# Patient Record
Sex: Male | Born: 1945 | Race: White | Hispanic: No | Marital: Married | Smoking: Former smoker
Health system: Southern US, Community
[De-identification: ages and names within clinical notes are randomized; demographics above are authoritative.]

## PROBLEM LIST (undated history)

## (undated) DIAGNOSIS — I10 Essential (primary) hypertension: Secondary | ICD-10-CM

## (undated) DIAGNOSIS — I719 Aortic aneurysm of unspecified site, without rupture: Secondary | ICD-10-CM

## (undated) DIAGNOSIS — I219 Acute myocardial infarction, unspecified: Secondary | ICD-10-CM

## (undated) DIAGNOSIS — I2510 Atherosclerotic heart disease of native coronary artery without angina pectoris: Secondary | ICD-10-CM

## (undated) HISTORY — PX: AORTA SURGERY: SHX548

## (undated) HISTORY — PX: CORONARY STENT PLACEMENT: SHX1402

---

## 2011-12-21 ENCOUNTER — Emergency Department (INDEPENDENT_AMBULATORY_CARE_PROVIDER_SITE_OTHER): Payer: BC Managed Care – PPO

## 2011-12-21 ENCOUNTER — Emergency Department (HOSPITAL_BASED_OUTPATIENT_CLINIC_OR_DEPARTMENT_OTHER)
Admission: EM | Admit: 2011-12-21 | Discharge: 2011-12-22 | Disposition: A | Discharge status: Home / Self Care | Payer: BC Managed Care – PPO | Attending: Emergency Medicine | Admitting: Emergency Medicine

## 2011-12-21 ENCOUNTER — Encounter (HOSPITAL_BASED_OUTPATIENT_CLINIC_OR_DEPARTMENT_OTHER): Payer: Self-pay | Admitting: *Deleted

## 2011-12-21 DIAGNOSIS — R197 Diarrhea, unspecified: Secondary | ICD-10-CM | POA: Insufficient documentation

## 2011-12-21 DIAGNOSIS — I10 Essential (primary) hypertension: Secondary | ICD-10-CM | POA: Insufficient documentation

## 2011-12-21 DIAGNOSIS — R109 Unspecified abdominal pain: Secondary | ICD-10-CM | POA: Insufficient documentation

## 2011-12-21 DIAGNOSIS — Z7982 Long term (current) use of aspirin: Secondary | ICD-10-CM | POA: Insufficient documentation

## 2011-12-21 DIAGNOSIS — I252 Old myocardial infarction: Secondary | ICD-10-CM | POA: Insufficient documentation

## 2011-12-21 DIAGNOSIS — K529 Noninfective gastroenteritis and colitis, unspecified: Secondary | ICD-10-CM

## 2011-12-21 DIAGNOSIS — Z79899 Other long term (current) drug therapy: Secondary | ICD-10-CM | POA: Insufficient documentation

## 2011-12-21 DIAGNOSIS — K5289 Other specified noninfective gastroenteritis and colitis: Secondary | ICD-10-CM | POA: Insufficient documentation

## 2011-12-21 DIAGNOSIS — R1033 Periumbilical pain: Secondary | ICD-10-CM

## 2011-12-21 DIAGNOSIS — R1031 Right lower quadrant pain: Secondary | ICD-10-CM

## 2011-12-21 DIAGNOSIS — I2510 Atherosclerotic heart disease of native coronary artery without angina pectoris: Secondary | ICD-10-CM | POA: Insufficient documentation

## 2011-12-21 HISTORY — DX: Aortic aneurysm of unspecified site, without rupture: I71.9

## 2011-12-21 HISTORY — DX: Acute myocardial infarction, unspecified: I21.9

## 2011-12-21 HISTORY — DX: Atherosclerotic heart disease of native coronary artery without angina pectoris: I25.10

## 2011-12-21 HISTORY — DX: Essential (primary) hypertension: I10

## 2011-12-21 LAB — URINALYSIS, ROUTINE W REFLEX MICROSCOPIC
Glucose, UA: NEGATIVE mg/dL
Leukocytes, UA: NEGATIVE
Protein, ur: NEGATIVE mg/dL
Specific Gravity, Urine: 1.021 (ref 1.005–1.030)
Urobilinogen, UA: 1 mg/dL (ref 0.0–1.0)

## 2011-12-21 LAB — DIFFERENTIAL
Basophils Absolute: 0 10*3/uL (ref 0.0–0.1)
Eosinophils Absolute: 0.2 10*3/uL (ref 0.0–0.7)
Eosinophils Relative: 2 % (ref 0–5)
Lymphocytes Relative: 9 % — ABNORMAL LOW (ref 12–46)
Monocytes Absolute: 0.9 10*3/uL (ref 0.1–1.0)

## 2011-12-21 LAB — CBC
HCT: 36.1 % — ABNORMAL LOW (ref 39.0–52.0)
MCH: 30.8 pg (ref 26.0–34.0)
MCHC: 34.3 g/dL (ref 30.0–36.0)
MCV: 89.6 fL (ref 78.0–100.0)
Platelets: 154 10*3/uL (ref 150–400)
RDW: 12.2 % (ref 11.5–15.5)
WBC: 13.2 10*3/uL — ABNORMAL HIGH (ref 4.0–10.5)

## 2011-12-21 LAB — COMPREHENSIVE METABOLIC PANEL
AST: 21 U/L (ref 0–37)
CO2: 30 mEq/L (ref 19–32)
Calcium: 9.1 mg/dL (ref 8.4–10.5)
Creatinine, Ser: 1.4 mg/dL — ABNORMAL HIGH (ref 0.50–1.35)
GFR calc Af Amer: 59 mL/min — ABNORMAL LOW (ref >90)
GFR calc non Af Amer: 51 mL/min — ABNORMAL LOW (ref >90)
Total Protein: 7.3 g/dL (ref 6.0–8.3)

## 2011-12-21 MED ORDER — OXYCODONE-ACETAMINOPHEN 5-325 MG PO TABS
1.0000 | ORAL_TABLET | Freq: Once | ORAL | Status: AC
Start: 2011-12-21 — End: 2011-12-21
  Administered 2011-12-21: 1 via ORAL
  Filled 2011-12-21: qty 1

## 2011-12-21 MED ORDER — OXYCODONE-ACETAMINOPHEN 5-325 MG PO TABS: 1.0000 | ORAL_TABLET | ORAL | Status: AC | PRN

## 2011-12-21 MED ORDER — METRONIDAZOLE IN NACL 5-0.79 MG/ML-% IV SOLN
500.0000 mg | Freq: Once | INTRAVENOUS | Status: AC
  Administered 2011-12-22: 500 mg via INTRAVENOUS
  Filled 2011-12-21: qty 100

## 2011-12-21 MED ORDER — CIPROFLOXACIN IN D5W 400 MG/200ML IV SOLN
400.0000 mg | Freq: Once | INTRAVENOUS | Status: AC
  Administered 2011-12-21: 400 mg via INTRAVENOUS
  Filled 2011-12-21: qty 200

## 2011-12-21 MED ORDER — PANTOPRAZOLE SODIUM 40 MG IV SOLR
40.0000 mg | Freq: Once | INTRAVENOUS | Status: AC
  Administered 2011-12-21: 40 mg via INTRAVENOUS
  Filled 2011-12-21: qty 40

## 2011-12-21 MED ORDER — ONDANSETRON HCL 4 MG/2ML IJ SOLN
4.0000 mg | Freq: Once | INTRAMUSCULAR | Status: AC
  Administered 2011-12-21: 4 mg via INTRAVENOUS
  Filled 2011-12-21: qty 2

## 2011-12-21 MED ORDER — HYDROMORPHONE HCL PF 1 MG/ML IJ SOLN
1.0000 mg | Freq: Once | INTRAMUSCULAR | Status: AC
  Administered 2011-12-21: 1 mg via INTRAVENOUS
  Filled 2011-12-21: qty 1

## 2011-12-21 MED ORDER — CIPROFLOXACIN HCL 500 MG PO TABS: 500.0000 mg | ORAL_TABLET | Freq: Two times a day (BID) | ORAL | Status: AC

## 2011-12-21 MED ORDER — ONDANSETRON 8 MG PO TBDP: 8.0000 mg | ORAL_TABLET | Freq: Three times a day (TID) | ORAL | Status: AC | PRN

## 2011-12-21 MED ORDER — IOHEXOL 300 MG/ML  SOLN
100.0000 mL | Freq: Once | INTRAMUSCULAR | Status: AC | PRN
  Administered 2011-12-21: 100 mL via INTRAVENOUS

## 2011-12-21 MED ORDER — IOHEXOL 300 MG/ML  SOLN
40.0000 mL | Freq: Once | INTRAMUSCULAR | Status: AC | PRN
  Administered 2011-12-21: 40 mL via ORAL

## 2011-12-21 MED ORDER — GI COCKTAIL ~~LOC~~
30.0000 mL | Freq: Once | ORAL | Status: AC
  Administered 2011-12-21: 30 mL via ORAL
  Filled 2011-12-21: qty 30

## 2011-12-21 MED ORDER — METRONIDAZOLE 500 MG PO TABS: 500.0000 mg | ORAL_TABLET | Freq: Two times a day (BID) | ORAL | Status: AC

## 2011-12-21 NOTE — ED Provider Notes (Signed)
History   This chart was scribed for Hilario Quarry, MD by Shari Heritage. The patient was seen in room MH11/MH11. Patient's care was started at 2100.  CSN: [id number]  Arrival date & time 12/21/11  2100   First MD Initiated Contact with Patient 12/21/11 2131      Chief Complaint  Patient presents with  . Abdominal Pain   Patient is a 66 y.o. male presenting with abdominal pain. The history is provided by the patient. No language interpreter was used.  Abdominal Pain The primary symptoms of the illness include abdominal pain.   Eugene Bond is a 66 y.o. male who presents to the Emergency Department complaining of non-radiating, burning, moderate to severe RLQ and periumbilical bdominal pain onset several hours ago after eating a sub. Patient reports associated diarrhea. Patient says that stool was loose with no hematochezia. Patient hs no recent history of reflex. Patient takes Crestor (10 mg), Aspirin and Plavix regularly. Patient denies nausea and vomiting. Patient with h/of HT, MI and aortic aneurysm.   PCP - [facility] - Chales Abrahams  Past Medical History  Diagnosis Date  . Myocardial infarct   . Aortic aneurysm   . Coronary artery disease   . Hypertension     Past Surgical History  Procedure Date  . Coronary stent placement   . Aorta surgery     History reviewed. No pertinent family history.  History  Substance Use Topics  . Smoking status: Former Games developer  . Smokeless tobacco: Not on file  . Alcohol Use: No      Review of Systems  Gastrointestinal: Positive for abdominal pain.   A complete 10 system review of systems was obtained and all systems are negative except as noted in the HPI and PMH.   Allergies  Review of patient's allergies indicates no known allergies.  Home Medications   Current Outpatient Rx  Name Route Sig Dispense Refill  . ASPIRIN 325 MG PO TBEC Oral Take 325 mg by mouth daily.    . ATENOLOL 100 MG PO TABS Oral Take 12.5 mg by  mouth daily.    Marland Kitchen CLOPIDOGREL BISULFATE 75 MG PO TABS Oral Take 75 mg by mouth daily.    Marland Kitchen LOSARTAN POTASSIUM 50 MG PO TABS Oral Take 50 mg by mouth daily.    Marland Kitchen ROSUVASTATIN CALCIUM 10 MG PO TABS Oral Take 10 mg by mouth daily.      BP 161/103  Pulse 46  Temp(Src) 98.2 F (36.8 C) (Oral)  Resp 16  SpO2 99%  Physical Exam  Nursing note and vitals reviewed. Constitutional: He is oriented to person, place, and time. He appears well-developed and well-nourished.  HENT:  Head: Normocephalic and atraumatic.  Eyes: Conjunctivae and EOM are normal. Pupils are equal, round, and reactive to light.  Neck: Normal range of motion. Neck supple.  Cardiovascular: Normal rate and regular rhythm.   Pulmonary/Chest: Effort normal and breath sounds normal.  Abdominal: Soft. Bowel sounds are normal. There is tenderness (RLQ).  Musculoskeletal: Normal range of motion.  Neurological: He is alert and oriented to person, place, and time.  Skin: Skin is warm and dry.  Psychiatric: He has a normal mood and affect.    ED Course  Procedures (including critical care time) DIAGNOSTIC STUDIES: Oxygen Saturation is 99% on room air, normal by my interpretation.    COORDINATION OF CARE: 9:36PM- Patient informed of current plan for treatment and evaluation and agrees with plan at this time. Will order labs and administer  IV fluids with pain medications.  Labs Reviewed  CBC - Abnormal; Notable for the following:    WBC 13.2 (*)    RBC 4.03 (*)    Hemoglobin 12.4 (*)    HCT 36.1 (*)    All other components within normal limits  DIFFERENTIAL - Abnormal; Notable for the following:    Neutrophils Relative 83 (*)    Neutro Abs 10.9 (*)    Lymphocytes Relative 9 (*)    All other components within normal limits  COMPREHENSIVE METABOLIC PANEL - Abnormal; Notable for the following:    Glucose, Bld 118 (*)    Creatinine, Ser 1.40 (*)    Total Bilirubin 0.2 (*)    GFR calc non Af Amer 51 (*)    GFR calc Af  Amer 59 (*)    All other components within normal limits  LIPASE, BLOOD  URINALYSIS, ROUTINE W REFLEX MICROSCOPIC   Ct Abdomen Pelvis W Contrast  12/21/2011  *RADIOLOGY REPORT*  Clinical Data: Periumbilical and right lower quadrant pain.  CT ABDOMEN AND PELVIS WITH CONTRAST  Technique:  Multidetector CT imaging of the abdomen and pelvis was performed following the standard protocol during bolus administration of intravenous contrast.  Contrast: 40mL OMNIPAQUE IOHEXOL 300 MG/ML  SOLN, OMNIPAQUE IOHEXOL 300 MG/ML  SOLN  Comparison: None.  Findings: The lung bases are clear.  No pleural or pericardial effusion.  There is marked thickening of the walls of the cecum with infiltration of surrounding fat.  The appendix is well seen and normal in appearance.  The colon is otherwise normal in appearance. Stomach and small bowel appear normal.  There is no fluid collection.  Fecal type material the distal ileum is consistent with slow transit.  No lymphadenopathy is seen.  The gallbladder, liver, adrenal glands, spleen, pancreas and kidneys appear normal.  The patient is status post aortic aneurysm repair with a stent graft in place.  No evidence of hemorrhage. There is no focal bony abnormality.  IMPRESSION:  1.  Marked wall thickening of the cecum with surrounding infiltration of fat is likely due to infectious or inflammatory change.  If the patient is not current with colon cancer screening, screening is recommended after his acute episode has passed. 2.  Normal appendix. 3.  Status post aortic aneurysm repair without evidence of complication.  Original Report Authenticated By: Bernadene Bell. Maricela Curet, M.D.     No diagnosis found.  Date: 12/21/2011  Rate: 41  Rhythm: sinus bradycardia  QRS Axis: normal  Intervals: normal  ST/T Wave abnormalities: normal  Conduction Disutrbances:none  Narrative Interpretation:   Old EKG Reviewed: none available     MDM  Patient with wall thickening of the cecum  noted on CT scan. He is given Cipro and Flagyl here. Patient is advised of the above findings. He'll be treated with Cipro and Flagyl at home. He is advised followup with his primary care doctor for referral for followup colonoscopy after the Cipro and Flagyl have been completed.  The patient with marked sinus bradycardia on his EKG consistent with atenolol use. His blood pressure has been high to normal here.   Patient with improved symptom after medication here and understand plan.   I personally performed the services described in this documentation, which was scribed in my presence. The recorded information has been reviewed and considered.        Hilario Quarry, MD 12/22/11 [phone]

## 2011-12-21 NOTE — ED Notes (Signed)
Education done with pt and family with regards to EKG and CT scan.  Dr Rosalia Hammers at bedside explaining results to pt.  Pt and family had additional questions re: inflammation and infection of colon.  Re-ititerated Dr Rays explanations and urged pt to follow up with GI.  Pt and family both stated understanding

## 2011-12-21 NOTE — Discharge Instructions (Signed)
Colitis Colitis is inflammation of the colon. Colitis can be a short-term or long-standing (chronic) illness. Crohn's disease and ulcerative colitis are 2 types of colitis which are chronic. They usually require lifelong treatment. CAUSES  There are many different causes of colitis, including:  Viruses.   Germs (bacteria).   Medicine reactions.  SYMPTOMS   Diarrhea.   Intestinal bleeding.   Pain.   Fever.   Throwing up (vomiting).   Tiredness (fatigue).   Weight loss.   Bowel blockage.  DIAGNOSIS  The diagnosis of colitis is based on examination and stool or blood tests. X-rays, CT scan, and colonoscopy may also be needed. TREATMENT  Treatment may include:  Fluids given through the vein (intravenously).   Bowel rest (nothing to eat or drink for a period of time).   Medicine for pain and diarrhea.   Medicines (antibiotics) that kill germs.   Cortisone medicines.   Surgery.  HOME CARE INSTRUCTIONS   Get plenty of rest.   Drink enough water and fluids to keep your urine clear or pale yellow.   Eat a well-balanced diet.   Call your caregiver for follow-up as recommended.  SEEK IMMEDIATE MEDICAL CARE IF:   You develop chills.   You have an oral temperature above 102 F (38.9 C), not controlled by medicine.   You have extreme weakness, fainting, or dehydration.   You have repeated vomiting.   You develop severe belly (abdominal) pain or are passing bloody or tarry stools.  MAKE SURE YOU:   Understand these instructions.   Will watch your condition.   Will get help right away if you are not doing well or get worse.  Document Released: 09/05/2004 Document Revised: 07/18/2011 Document Reviewed: 12/01/2009 [facility] Patient Information 2012 [place], [place].   Please return if worse at any time as we discussed. Please recheck with your Dr. on Monday and have him review the CT scan and arrange for followup for colonoscopy.

## 2011-12-21 NOTE — ED Notes (Signed)
Pt states that this PM after lunch he began having generalized abd pain he describes as a "ball of fire in his stomach" denies N/V but has had 2 episodes of diarrhea

## 2011-12-22 NOTE — ED Notes (Signed)
rx x 4 given for zofran, percoet, cipro and flagyl

## 2013-02-27 IMAGING — CT ABDOMEN^ABDOMEN_PELVIS_WITH (ADULT)
2 of 5 series · 17 of 46 positions shown, 19 images · IV contrast (APPLIED)
Comparison: None.

CLINICAL DATA: Periumbilical and right lower quadrant pain.

CT ABDOMEN AND PELVIS WITH CONTRAST
TECHNIQUE: Multidetector CT imaging of the abdomen and pelvis was
performed following the standard protocol during bolus
administration of intravenous contrast.
Contrast: 40mL OMNIPAQUE IOHEXOL 300 MG/ML  SOLN, 100mL OMNIPAQUE
IOHEXOL 300 MG/ML  SOLN

[Series 2: abd/pelvis 5.0 b31f · axial · 0.71mm/px · z∈[-479,-59]mm · 14 of 94 slices shown, 16 images]
[im 5/94  soft-tissue]
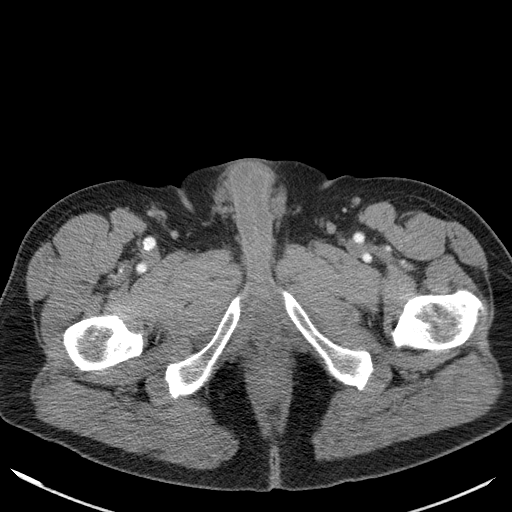
[im 5/94  bone]
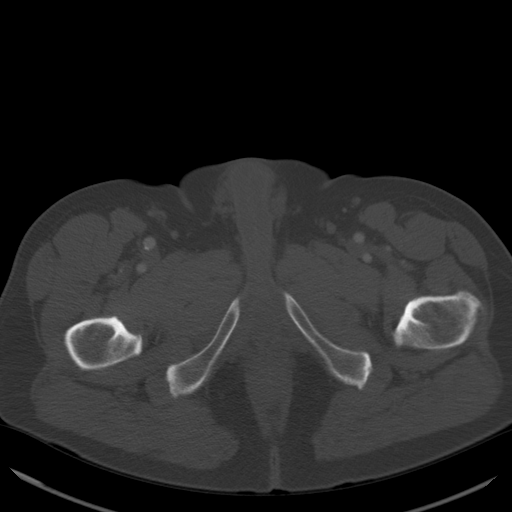
[im 14/94  soft-tissue]
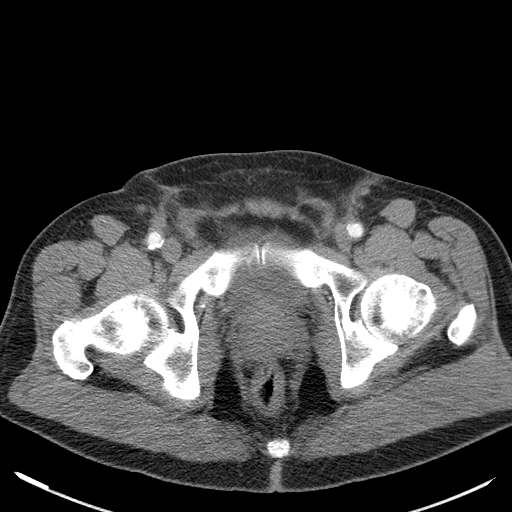
[im 19/94  soft-tissue]
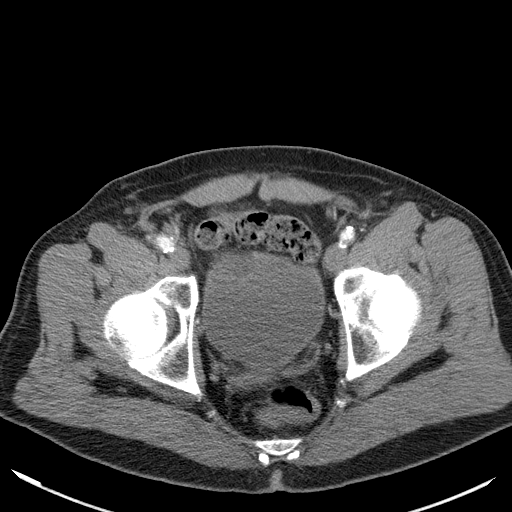
[im 24/94  soft-tissue]
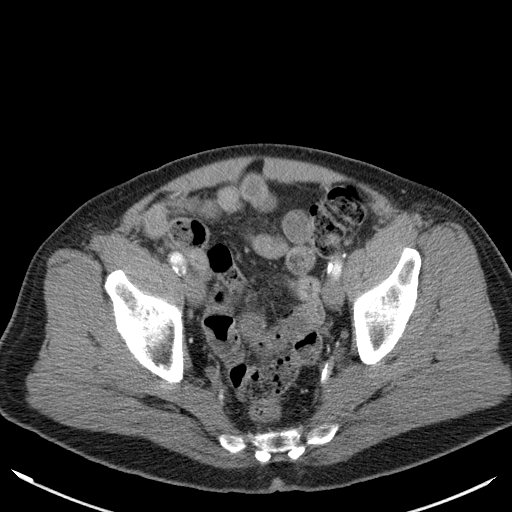
[im 33/94  soft-tissue]
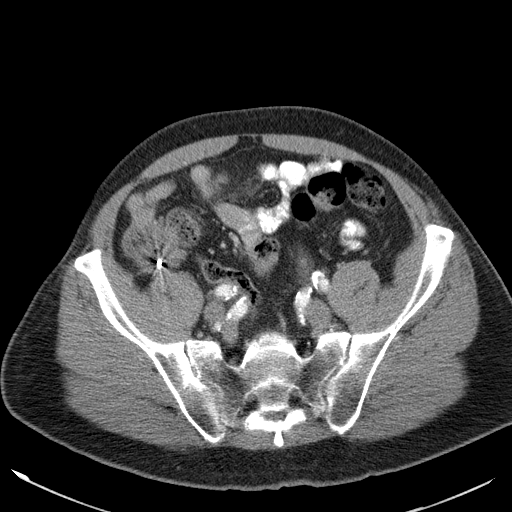
[im 38/94  soft-tissue]
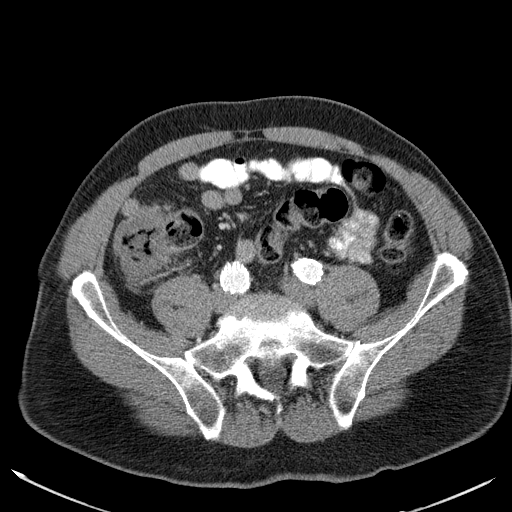
[im 42/94  soft-tissue]
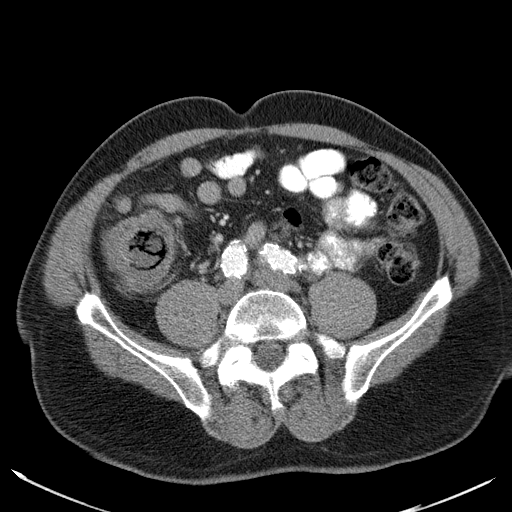
[im 52/94  soft-tissue]
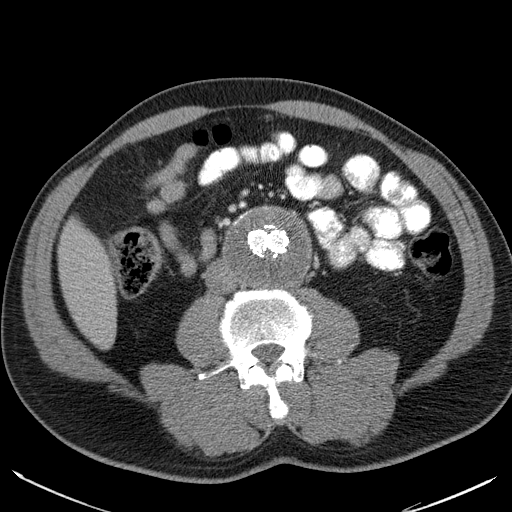
[im 56/94  soft-tissue]
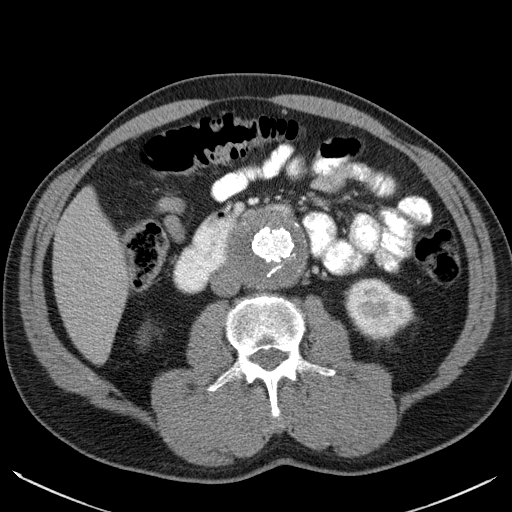
[im 56/94  bone]
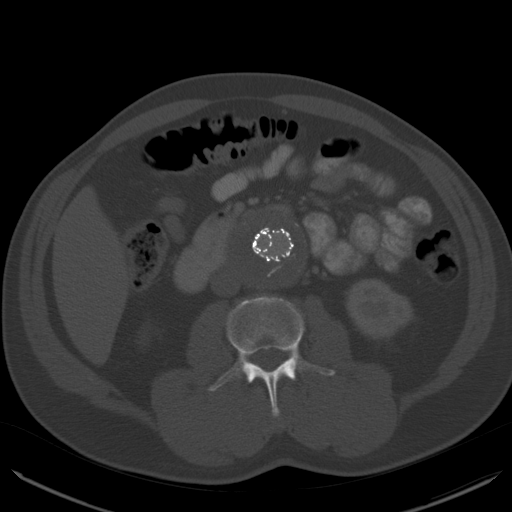
[im 61/94  soft-tissue]
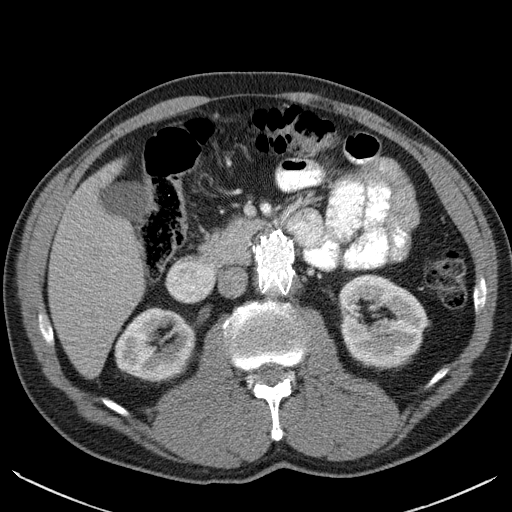
[im 70/94  soft-tissue]
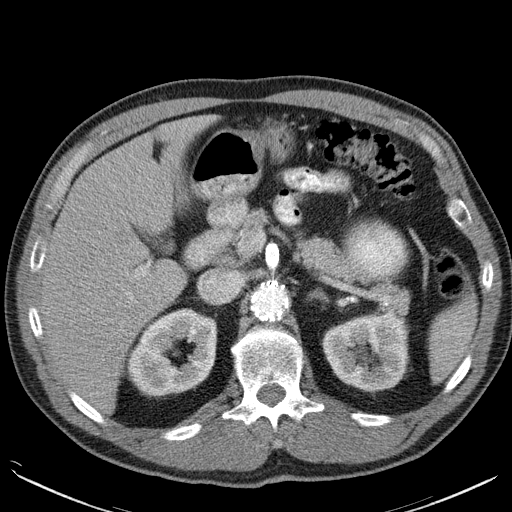
[im 75/94  soft-tissue]
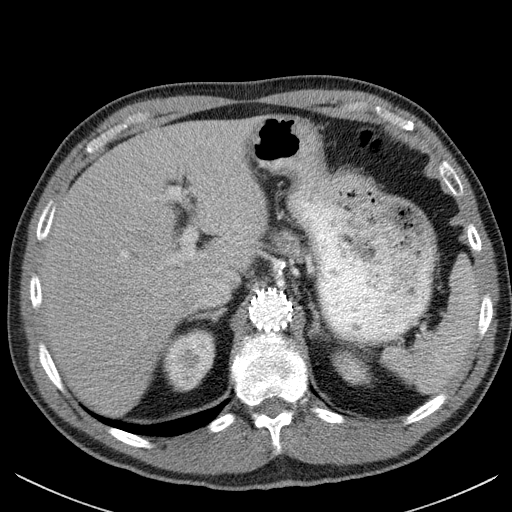
[im 80/94  soft-tissue]
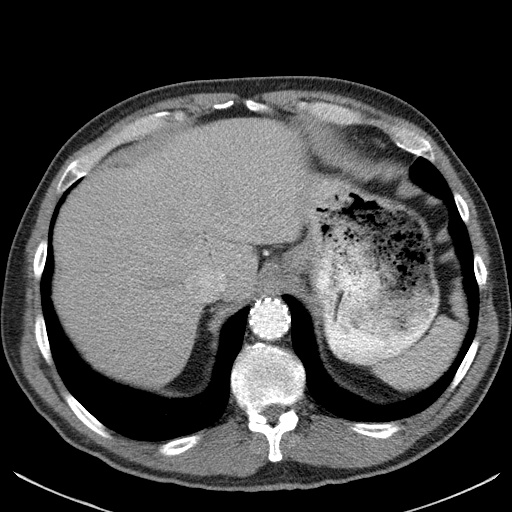
[im 89/94  soft-tissue]
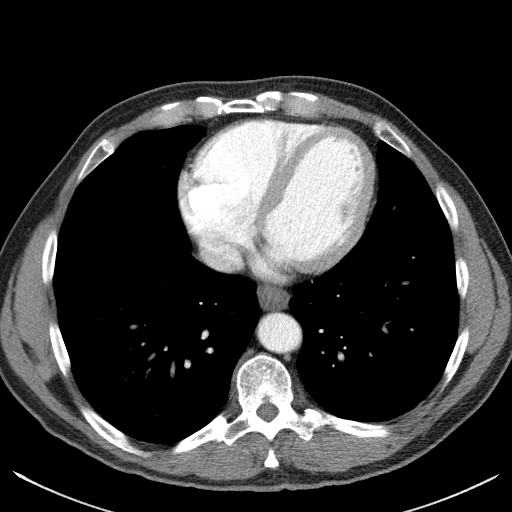

[Series 5: abd/pelvis 3.0 coronal · coronal · 0.87mm/px · 3 of 87 slices shown]
[im 29/87  soft-tissue]
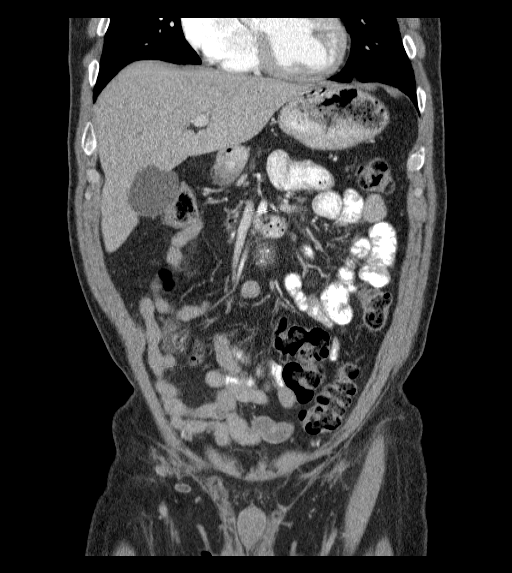
[im 39/87  soft-tissue]
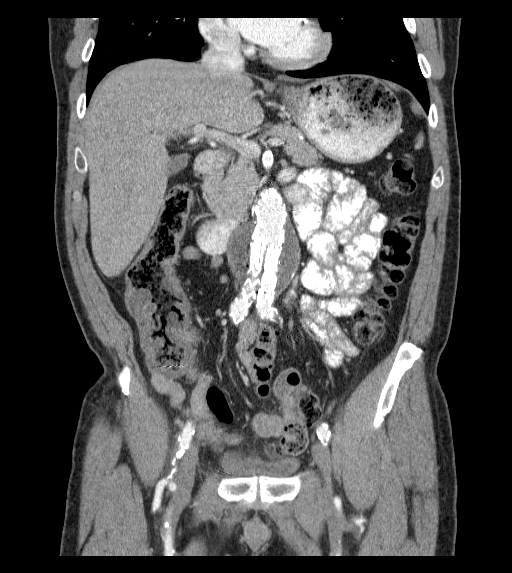
[im 48/87  soft-tissue]
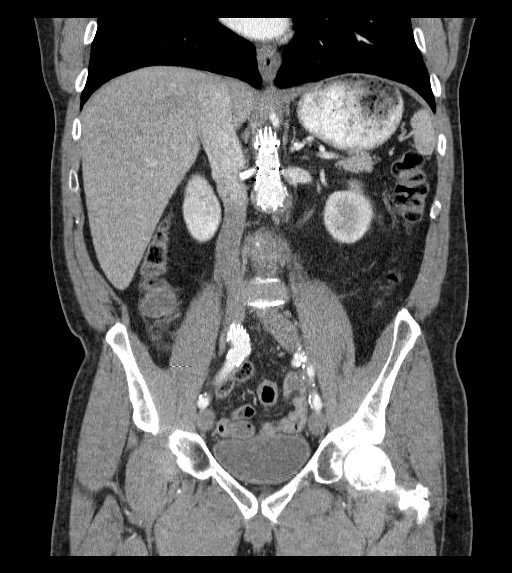

[17 of 46 positions shown; findings below may reference images not displayed]

FINDINGS: The lung bases are clear.  No pleural or pericardial
effusion.

There is marked thickening of the walls of the cecum with
infiltration of surrounding fat.  The appendix is well seen and
normal in appearance.  The colon is otherwise normal in appearance.
Stomach and small bowel appear normal.  There is no fluid
collection.  Fecal type material the distal ileum is consistent
with slow transit.  No lymphadenopathy is seen.

The gallbladder, liver, adrenal glands, spleen, pancreas and
kidneys appear normal.  The patient is status post aortic aneurysm
repair with a stent graft in place.  No evidence of hemorrhage.
There is no focal bony abnormality.
IMPRESSION: 1.  Marked wall thickening of the cecum with surrounding
infiltration of fat is likely due to infectious or inflammatory
change.  If the patient is not current with colon cancer screening,
screening is recommended after his acute episode has passed.
2.  Normal appendix.
3.  Status post aortic aneurysm repair without evidence of
complication.

## 2016-03-03 ENCOUNTER — Emergency Department (HOSPITAL_BASED_OUTPATIENT_CLINIC_OR_DEPARTMENT_OTHER)
Admission: EM | Admit: 2016-03-03 | Discharge: 2016-03-03 | Disposition: A | Discharge status: Home / Self Care | Payer: Medicare Other | Attending: Emergency Medicine | Admitting: Emergency Medicine

## 2016-03-03 ENCOUNTER — Encounter (HOSPITAL_BASED_OUTPATIENT_CLINIC_OR_DEPARTMENT_OTHER): Payer: Self-pay

## 2016-03-03 DIAGNOSIS — Z7982 Long term (current) use of aspirin: Secondary | ICD-10-CM | POA: Insufficient documentation

## 2016-03-03 DIAGNOSIS — Z87891 Personal history of nicotine dependence: Secondary | ICD-10-CM | POA: Diagnosis not present

## 2016-03-03 DIAGNOSIS — Y929 Unspecified place or not applicable: Secondary | ICD-10-CM | POA: Diagnosis not present

## 2016-03-03 DIAGNOSIS — I10 Essential (primary) hypertension: Secondary | ICD-10-CM | POA: Diagnosis not present

## 2016-03-03 DIAGNOSIS — I2510 Atherosclerotic heart disease of native coronary artery without angina pectoris: Secondary | ICD-10-CM | POA: Insufficient documentation

## 2016-03-03 DIAGNOSIS — Y939 Activity, unspecified: Secondary | ICD-10-CM | POA: Diagnosis not present

## 2016-03-03 DIAGNOSIS — Y999 Unspecified external cause status: Secondary | ICD-10-CM | POA: Insufficient documentation

## 2016-03-03 DIAGNOSIS — W208XXA Other cause of strike by thrown, projected or falling object, initial encounter: Secondary | ICD-10-CM | POA: Diagnosis not present

## 2016-03-03 DIAGNOSIS — S61411A Laceration without foreign body of right hand, initial encounter: Secondary | ICD-10-CM | POA: Insufficient documentation

## 2016-03-03 DIAGNOSIS — I252 Old myocardial infarction: Secondary | ICD-10-CM | POA: Insufficient documentation

## 2016-03-03 DIAGNOSIS — S60511A Abrasion of right hand, initial encounter: Secondary | ICD-10-CM

## 2016-03-03 DIAGNOSIS — S60221A Contusion of right hand, initial encounter: Secondary | ICD-10-CM

## 2016-03-03 DIAGNOSIS — S6991XA Unspecified injury of right wrist, hand and finger(s), initial encounter: Secondary | ICD-10-CM | POA: Diagnosis present

## 2016-03-03 NOTE — ED Triage Notes (Signed)
Pt reports laceration to right hand yesterday. Bleeding controlled. Pt is on plavix. Reports last tetanus was within past 5 years.

## 2016-03-03 NOTE — ED Notes (Signed)
MD at bedside. 

## 2016-03-03 NOTE — Discharge Instructions (Signed)
Local wound care with bacitracin and dressing changes twice daily.  Return to the emergency department if you develop increasing pain, redness, pus straining from the wound, or streaking up or down the arm.

## 2016-03-03 NOTE — ED Provider Notes (Addendum)
[facility] Provider [facility]   CSN: [id number] Arrival date & time: 03/03/16  0630  First Provider Contact:  None       History   Chief Complaint Chief Complaint  Patient presents with  . Extremity Laceration    HPI Eugene Bond is a 70 y.o. male.  Patient is a 70 year old male with history of MI with stents currently taking Plavix. He presents for evaluation of a right hand injury. He states he was moving a piece of furniture yesterday when a shelf fell and struck him on the hand. He has a superficial laceration to the dorsum of the right hand and swelling. Last tetanus less than 5 years ago   The history is provided by the patient.    Past Medical History:  Diagnosis Date  . Aortic aneurysm (HCC)   . Coronary artery disease   . Hypertension   . Myocardial infarct (HCC)     There are no active problems to display for this patient.   Past Surgical History:  Procedure Laterality Date  . AORTA SURGERY    . CORONARY STENT PLACEMENT         Home Medications    Prior to Admission medications   Medication Sig Start Date End Date Taking? Authorizing Provider  aspirin 325 MG EC tablet Take 325 mg by mouth daily.    Historical Provider, MD  atenolol (TENORMIN) 100 MG tablet Take 12.5 mg by mouth daily.    Historical Provider, MD  clopidogrel (PLAVIX) 75 MG tablet Take 75 mg by mouth daily.    Historical Provider, MD  losartan (COZAAR) 50 MG tablet Take 50 mg by mouth daily.    Historical Provider, MD  rosuvastatin (CRESTOR) 10 MG tablet Take 10 mg by mouth daily.    Historical Provider, MD    Family History No family history on file.  Social History Social History  Substance Use Topics  . Smoking status: Former Games developer  . Smokeless tobacco: Current User  . Alcohol use No     Allergies   Review of patient's allergies indicates no known allergies.   Review of Systems Review of Systems  All other systems reviewed and are  negative.    Physical Exam Updated Vital Signs BP 145/62 (BP Location: Left Arm)   Pulse (!) 52   Temp 97.8 F (36.6 C) (Oral)   Resp 16   Ht  (1.753 m)   Wt 175 lb (79.4 kg)   SpO2 96%   BMI 25.84 kg/m   Physical Exam  Constitutional: He is oriented to person, place, and time. He appears well-developed and well-nourished. No distress.  HENT:  Head: Normocephalic and atraumatic.  Neck: Normal range of motion. Neck supple.  Musculoskeletal:  The right hand is noted to have some swelling and ecchymosis. There is a superficial skin tear to the dorsum of the hand. Bleeding is controlled. He has full range of motion of the entire hand with no discomfort. Distal Refill is brisk and motor and sensation are intact distally.  Neurological: He is alert and oriented to person, place, and time.  Skin: Skin is warm and dry. He is not diaphoretic.  Nursing note and vitals reviewed.    ED Treatments / Results  Labs (all labs ordered are listed, but only abnormal results are displayed) Labs Reviewed - No data to display  EKG  EKG Interpretation None       Radiology No results found.  Procedures Procedures (including critical care time)  Medications Ordered in ED Medications - No data to display   Initial Impression / Assessment and Plan / ED Course  I have reviewed the triage vital signs and the nursing notes.  Pertinent labs & imaging results that were available during my care of the patient were reviewed by me and considered in my medical decision making (see chart for details).  Clinical Course    Patient has a hand contusion with a skin tear. There is nothing that is amenable to sutures, and he has been nearly 24 hours since the injury. He will be treated with bacitracin, nonadherent dressing, and an Ace bandage. He will be advised to repeat this at home. He is to return to the ER for any problems.  Final Clinical Impressions(s) / ED Diagnoses   Final  diagnoses:  None    New Prescriptions New Prescriptions   No medications on file     Geoffery Lyons, MD 03/03/16 3893    Geoffery Lyons, MD 03/03/16 [phone]

## 2018-08-23 ENCOUNTER — Other Ambulatory Visit: Payer: Self-pay

## 2018-08-23 ENCOUNTER — Emergency Department (HOSPITAL_BASED_OUTPATIENT_CLINIC_OR_DEPARTMENT_OTHER): Payer: Medicare HMO

## 2018-08-23 ENCOUNTER — Encounter (HOSPITAL_BASED_OUTPATIENT_CLINIC_OR_DEPARTMENT_OTHER): Payer: Self-pay | Admitting: Emergency Medicine

## 2018-08-23 ENCOUNTER — Emergency Department (HOSPITAL_BASED_OUTPATIENT_CLINIC_OR_DEPARTMENT_OTHER)
Admission: EM | Admit: 2018-08-23 | Discharge: 2018-08-23 | Disposition: A | Discharge status: Home / Self Care | Payer: Medicare HMO | Attending: Emergency Medicine | Admitting: Emergency Medicine

## 2018-08-23 DIAGNOSIS — Z7902 Long term (current) use of antithrombotics/antiplatelets: Secondary | ICD-10-CM | POA: Insufficient documentation

## 2018-08-23 DIAGNOSIS — Z79899 Other long term (current) drug therapy: Secondary | ICD-10-CM | POA: Diagnosis not present

## 2018-08-23 DIAGNOSIS — F17290 Nicotine dependence, other tobacco product, uncomplicated: Secondary | ICD-10-CM | POA: Insufficient documentation

## 2018-08-23 DIAGNOSIS — I10 Essential (primary) hypertension: Secondary | ICD-10-CM | POA: Diagnosis not present

## 2018-08-23 DIAGNOSIS — Z7982 Long term (current) use of aspirin: Secondary | ICD-10-CM | POA: Diagnosis not present

## 2018-08-23 DIAGNOSIS — R55 Syncope and collapse: Secondary | ICD-10-CM | POA: Diagnosis not present

## 2018-08-23 DIAGNOSIS — R001 Bradycardia, unspecified: Secondary | ICD-10-CM

## 2018-08-23 DIAGNOSIS — I2510 Atherosclerotic heart disease of native coronary artery without angina pectoris: Secondary | ICD-10-CM | POA: Diagnosis not present

## 2018-08-23 DIAGNOSIS — I252 Old myocardial infarction: Secondary | ICD-10-CM | POA: Diagnosis not present

## 2018-08-23 DIAGNOSIS — R531 Weakness: Secondary | ICD-10-CM | POA: Diagnosis present

## 2018-08-23 DIAGNOSIS — Z955 Presence of coronary angioplasty implant and graft: Secondary | ICD-10-CM | POA: Diagnosis not present

## 2018-08-23 LAB — URINALYSIS, ROUTINE W REFLEX MICROSCOPIC
Bilirubin Urine: NEGATIVE
Glucose, UA: NEGATIVE mg/dL
Hgb urine dipstick: NEGATIVE
KETONES UR: NEGATIVE mg/dL
LEUKOCYTES UA: NEGATIVE
NITRITE: NEGATIVE
PH: 6.5 (ref 5.0–8.0)
Protein, ur: NEGATIVE mg/dL

## 2018-08-23 LAB — CBC
HEMATOCRIT: 31.5 % — AB (ref 39.0–52.0)
Hemoglobin: 10.2 g/dL — ABNORMAL LOW (ref 13.0–17.0)
MCH: 30.5 pg (ref 26.0–34.0)
MCHC: 32.4 g/dL (ref 30.0–36.0)
MCV: 94.3 fL (ref 80.0–100.0)
NRBC: 0 % (ref 0.0–0.2)
Platelets: 177 10*3/uL (ref 150–400)
RBC: 3.34 MIL/uL — AB (ref 4.22–5.81)
RDW: 12.3 % (ref 11.5–15.5)
WBC: 8.9 10*3/uL (ref 4.0–10.5)

## 2018-08-23 LAB — BASIC METABOLIC PANEL
Anion gap: 7 (ref 5–15)
BUN: 17 mg/dL (ref 8–23)
CHLORIDE: 95 mmol/L — AB (ref 98–111)
CO2: 25 mmol/L (ref 22–32)
Calcium: 8.8 mg/dL — ABNORMAL LOW (ref 8.9–10.3)
Creatinine, Ser: 1.24 mg/dL (ref 0.61–1.24)
GFR calc non Af Amer: 58 mL/min — ABNORMAL LOW (ref >60)
Glucose, Bld: 75 mg/dL (ref 70–99)
POTASSIUM: 4.5 mmol/L (ref 3.5–5.1)
SODIUM: 127 mmol/L — AB (ref 135–145)

## 2018-08-23 LAB — HEPATIC FUNCTION PANEL
ALBUMIN: 3.8 g/dL (ref 3.5–5.0)
ALK PHOS: 55 U/L (ref 38–126)
ALT: 20 U/L (ref 0–44)
AST: 25 U/L (ref 15–41)
Bilirubin, Direct: 0.1 mg/dL (ref 0.0–0.2)
Indirect Bilirubin: 0.4 mg/dL (ref 0.3–0.9)
TOTAL PROTEIN: 6.9 g/dL (ref 6.5–8.1)
Total Bilirubin: 0.5 mg/dL (ref 0.3–1.2)

## 2018-08-23 LAB — TROPONIN I: Troponin I: <0.03 ng/mL (ref <0.03)

## 2018-08-23 MED ORDER — SODIUM CHLORIDE 0.9 % IV BOLUS
1000.0000 mL | Freq: Once | INTRAVENOUS | Status: AC
Start: 2018-08-23 — End: 2018-08-23
  Administered 2018-08-23: 1000 mL via INTRAVENOUS

## 2018-08-23 NOTE — ED Notes (Signed)
Pt ambulated around ED with no c/o of sob, dizziness or pain/distress. HR 45 when returned to room

## 2018-08-23 NOTE — ED Provider Notes (Signed)
[facility] EMERGENCY DEPARTMENT Provider Note   CSN: [id number] Arrival date & time: 08/23/18  5997     History   Chief Complaint Chief Complaint  Patient presents with  . Weakness    HPI Eugene Bond is a 73 y.o. male.  Patient to track today suddenly became hot and diaphoretic was a little lightheaded.  Did not actually feel as if he was going to pass out but onlookers thought maybe he was going to.  Patient was treated with antibiotics for pneumonia at the beginning of the month.  He feels better on the antibiotics but he still has somewhat of a cough.  And expected that to be gone by now.  No fevers.  No chest pain.  No real shortness of breath.  Currently feels fine now.  Patient just finished a course of Levaquin.  He was evaluated at [facility] for the pneumonia was given 2 L of fluid and was discharged home same day.  Was not admitted.     Past Medical History:  Diagnosis Date  . Aortic aneurysm (HCC)   . Coronary artery disease   . Hypertension   . Myocardial infarct (HCC)     There are no active problems to display for this patient.   Past Surgical History:  Procedure Laterality Date  . AORTA SURGERY    . CORONARY STENT PLACEMENT          Home Medications    Prior to Admission medications   Medication Sig Start Date End Date Taking? Authorizing Provider  aspirin 325 MG EC tablet Take 325 mg by mouth daily.   Yes [provider]  atenolol (TENORMIN) 100 MG tablet Take 12.5 mg by mouth daily.   Yes [provider]  atorvastatin (LIPITOR) 40 MG tablet Take 40 mg by mouth daily.   Yes [provider]  clopidogrel (PLAVIX) 75 MG tablet Take 75 mg by mouth daily.   Yes [provider]  losartan (COZAAR) 50 MG tablet Take 50 mg by mouth daily.   Yes [provider]  pantoprazole (PROTONIX) 40 MG tablet Take 40 mg by mouth daily.   Yes [provider]  rosuvastatin (CRESTOR) 10  MG tablet Take 10 mg by mouth daily.    [provider]    Family History No family history on file.  Social History Social History   Tobacco Use  . Smoking status: Former Games developer  . Smokeless tobacco: Current User  Substance Use Topics  . Alcohol use: No  . Drug use: No     Allergies   Patient has no known allergies.   Review of Systems Review of Systems  Constitutional: Positive for diaphoresis. Negative for chills and fever.  HENT: Positive for congestion. Negative for rhinorrhea and sore throat.   Eyes: Negative for visual disturbance.  Respiratory: Positive for cough. Negative for shortness of breath.   Cardiovascular: Negative for chest pain and leg swelling.  Gastrointestinal: Negative for abdominal pain, diarrhea, nausea and vomiting.  Genitourinary: Negative for dysuria.  Musculoskeletal: Negative for back pain and neck pain.  Skin: Negative for rash.  Neurological: Positive for light-headedness. Negative for dizziness and headaches.  Hematological: Does not bruise/bleed easily.  Psychiatric/Behavioral: Negative for confusion.     Physical Exam Updated Vital Signs BP (!) 152/69 (BP Location: Right Arm)   Pulse (!) 44 Comment: pt states this is a normal HR for him  Temp 98.3 F (36.8 C) (Oral)   Resp 16  Ht 1.753 m (5\' 9" )   Wt 72.6 kg   SpO2 100%   BMI 23.63 kg/m   Physical Exam Vitals signs and nursing note reviewed.  Constitutional:      General: He is not in acute distress.    Appearance: Normal appearance. He is well-developed. He is not toxic-appearing.  HENT:     Head: Normocephalic and atraumatic.     Mouth/Throat:     Mouth: Mucous membranes are moist.  Eyes:     Conjunctiva/sclera: Conjunctivae normal.  Neck:     Musculoskeletal: Neck supple.  Cardiovascular:     Rate and Rhythm: Normal rate and regular rhythm.     Heart sounds: No murmur.  Pulmonary:     Effort: Pulmonary effort is normal. No respiratory distress.      Breath sounds: Normal breath sounds. No wheezing, rhonchi or rales.  Abdominal:     Palpations: Abdomen is soft.     Tenderness: There is no abdominal tenderness.  Musculoskeletal: Normal range of motion.  Skin:    General: Skin is warm and dry.     Capillary Refill: Capillary refill takes less than 2 seconds.  Neurological:     General: No focal deficit present.     Mental Status: He is alert and oriented to person, place, and time.      ED Treatments / Results  Labs (all labs ordered are listed, but only abnormal results are displayed) Labs Reviewed  BASIC METABOLIC PANEL - Abnormal; Notable for the following components:      Result Value   Sodium 127 (*)    Chloride 95 (*)    Calcium 8.8 (*)    GFR calc non Af Amer 58 (*)    All other components within normal limits  CBC - Abnormal; Notable for the following components:   RBC 3.34 (*)    Hemoglobin 10.2 (*)    HCT 31.5 (*)    All other components within normal limits  URINALYSIS, ROUTINE W REFLEX MICROSCOPIC - Abnormal; Notable for the following components:   Specific Gravity, Urine <1.005 (*)    All other components within normal limits  TROPONIN I  HEPATIC FUNCTION PANEL    EKG EKG Interpretation  Date/Time:  Sunday August 23 2018 09:35:57 EST Ventricular Rate:  42 PR Interval:    QRS Duration: 102 QT Interval:  477 QTC Calculation: 399 R Axis:   56 Text Interpretation:  Sinus bradycardia No previous ECGs available Confirmed by Vanetta MuldersZackowski, Lorrin Nawrot [phone]([id number]) on 08/23/2018 9:49:50 AM   Radiology Dg Chest 2 View  Result Date: 08/23/2018 CLINICAL DATA:  Cough. EXAM: CHEST - 2 VIEW COMPARISON:  None. FINDINGS: The heart size and mediastinal contours are within normal limits. Both lungs are clear. No pneumothorax or pleural effusion is noted. The visualized skeletal structures are unremarkable. IMPRESSION: No active cardiopulmonary disease. Electronically Signed   By: Lupita RaiderJames  Green Jr, M.D.   On: 08/23/2018 10:49     Procedures Procedures (including critical care time)  Medications Ordered in ED Medications  sodium chloride 0.9 % bolus 1,000 mL (0 mLs Intravenous Stopped 08/23/18 1342)     Initial Impression / Assessment and Plan / ED Course  I have reviewed the triage vital signs and the nursing notes.  Pertinent labs & imaging results that were available during my care of the patient were reviewed by me and considered in my medical decision making (see chart for details).     Work-up for the diaphoresis near syncopal symptoms without  any acute findings.  Patient ambulating here now fine.  No evidence of any further symptoms.  No evidence of any further pneumonia.  Patient did have some mild hyponatremia.  Did receive a liter of fluid here.  Will follow up with his doctor to have that rechecked.  Final Clinical Impressions(s) / ED Diagnoses   Final diagnoses:  Near syncope  Bradycardia, sinus    ED Discharge Orders    None       Vanetta Mulders, MD 09/01/18 1521

## 2018-08-23 NOTE — ED Triage Notes (Signed)
Pt reports that while at church he suddenly became hot and diaphoretic with light headedness. He states he was recently treated for pneumonia and has completed the antibiotics. Denies chest pain, SOB.

## 2018-08-23 NOTE — Discharge Instructions (Addendum)
Return for any new or worse symptoms.  Make an appointment to follow-up with your doctor.  Today's work-up without any significant findings.  Glad you feel better after the fluids.  Sodium here today was on the low side.  Will need to have that rechecked in about a week by her regular doctor.

## 2019-08-15 IMAGING — CR DG CHEST 2V
2 series · 2 of 2 positions shown · non-contrast
Comparison: None.

CLINICAL DATA: Cough.

EXAM:
CHEST - 2 VIEW

[w chest pa]
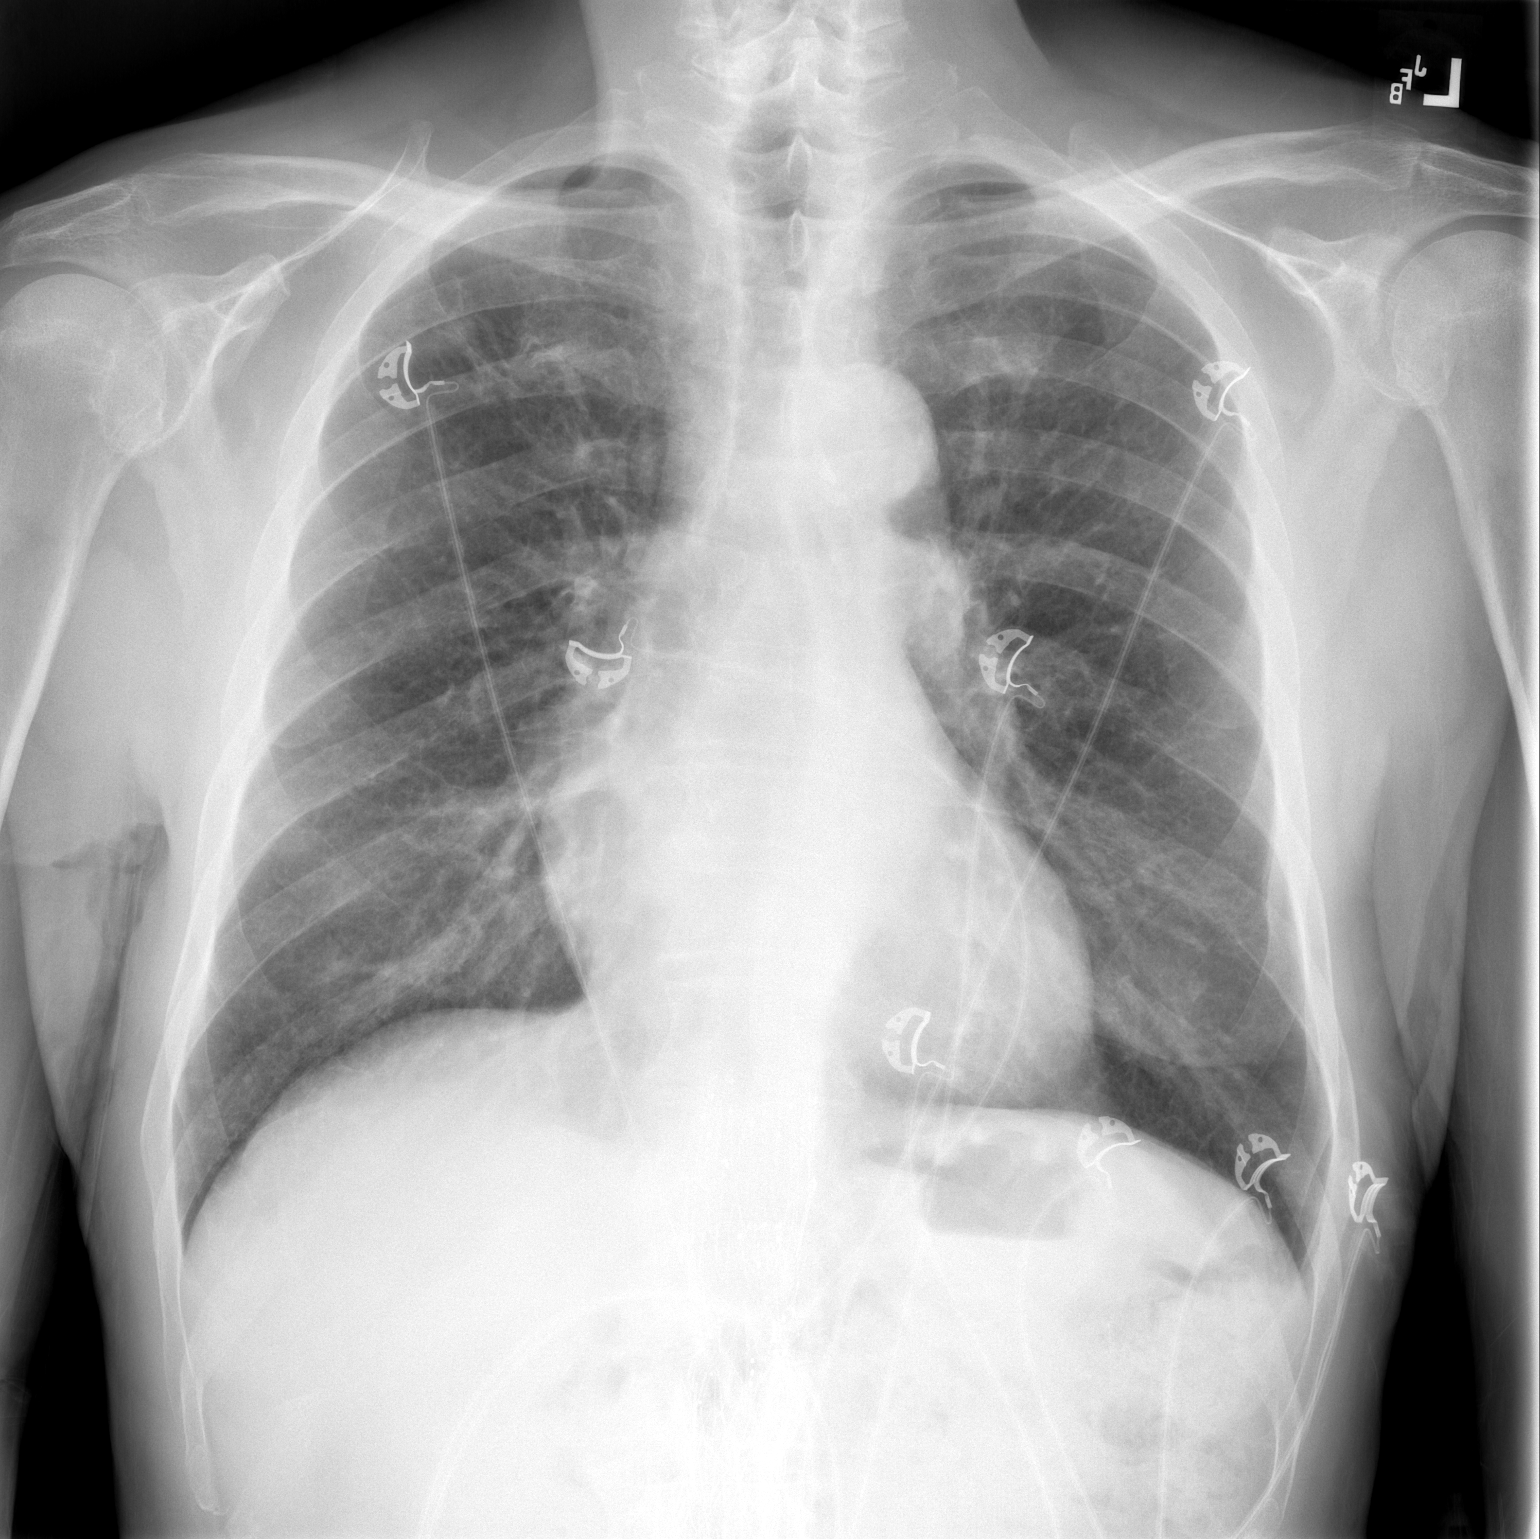

[w chest lat]
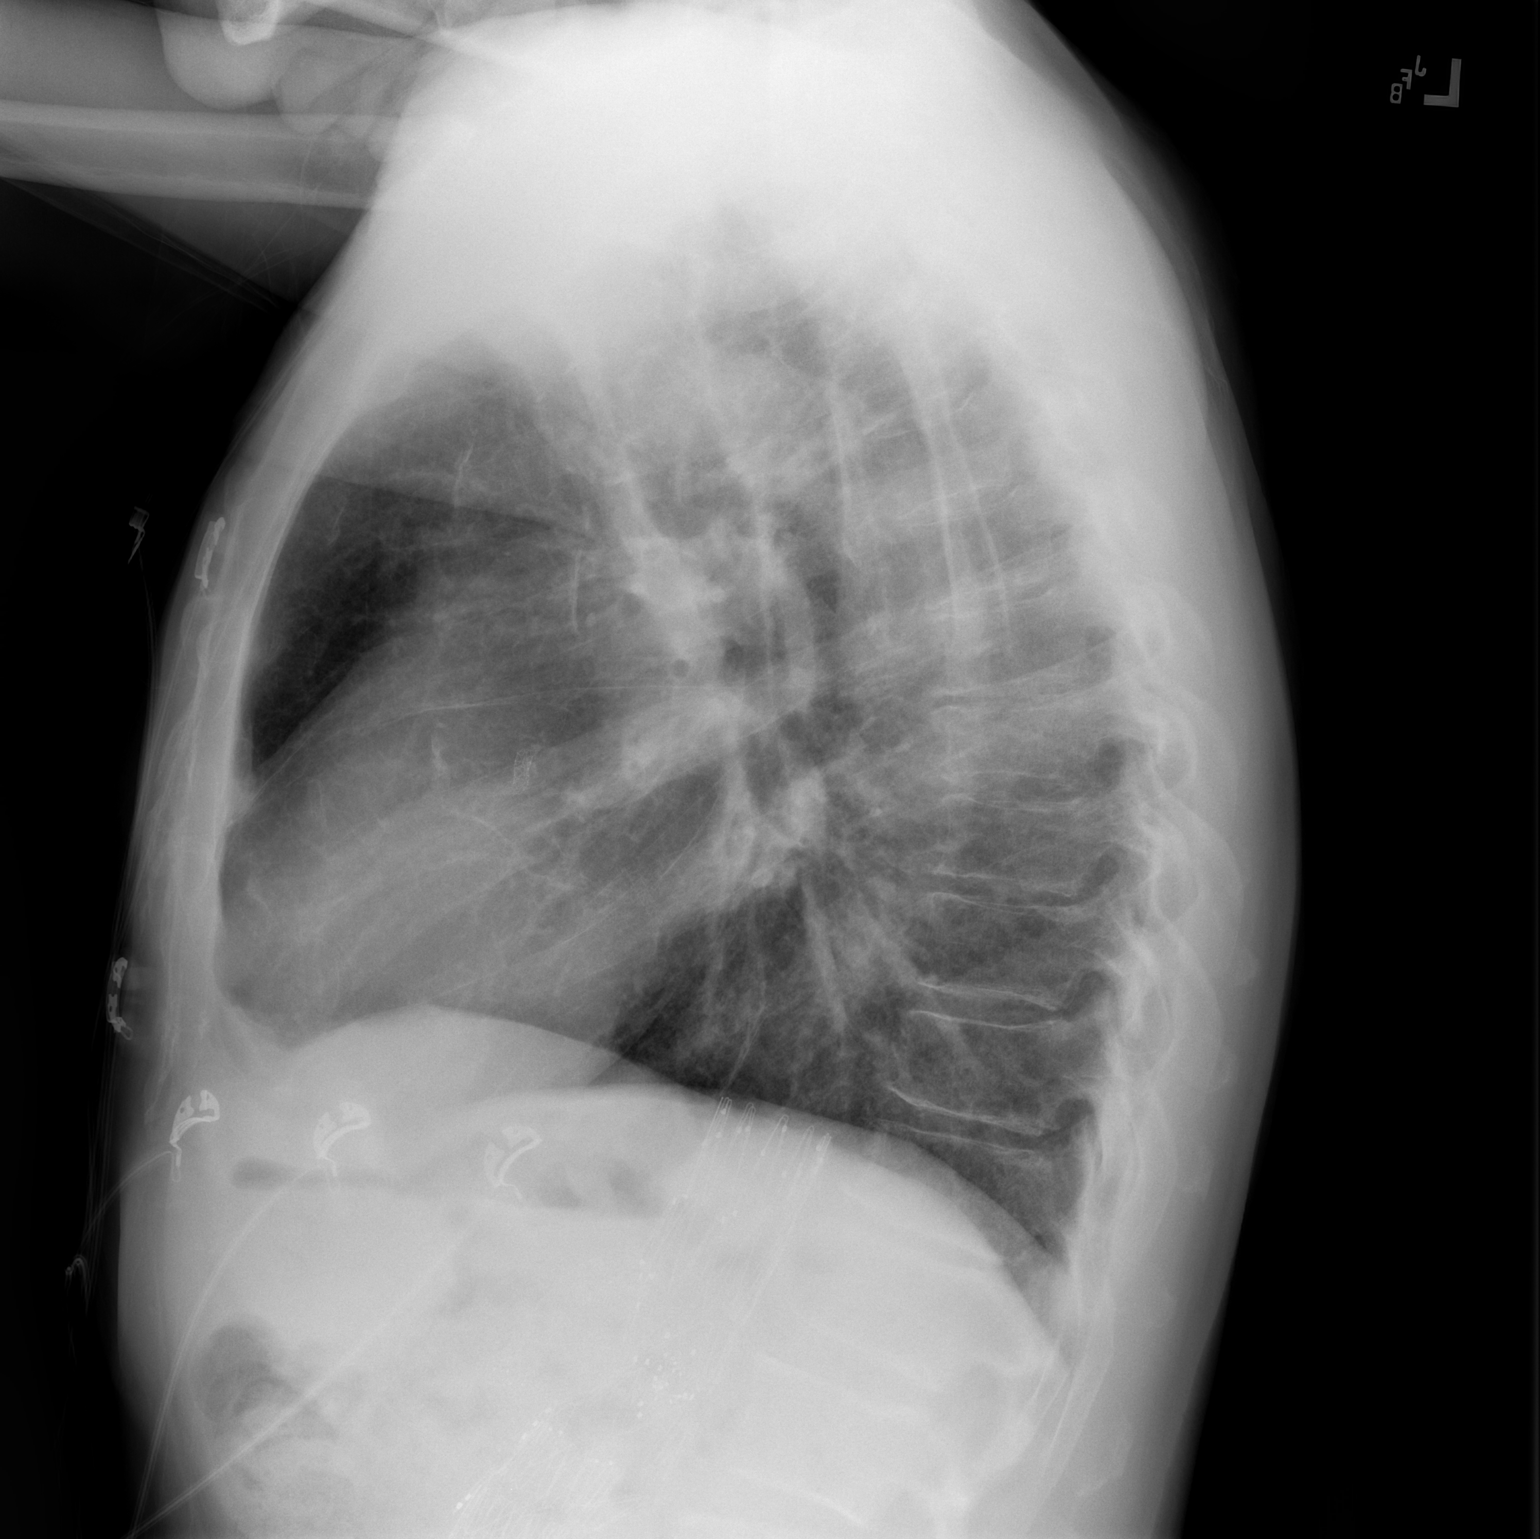

[2 of 2 positions shown; findings below may reference images not displayed]

FINDINGS: The heart size and mediastinal contours are within normal limits.
Both lungs are clear. No pneumothorax or pleural effusion is noted.
The visualized skeletal structures are unremarkable.
IMPRESSION: No active cardiopulmonary disease.

## 2023-08-20 ENCOUNTER — Encounter: Payer: Self-pay | Admitting: Urology

## 2023-08-20 ENCOUNTER — Ambulatory Visit: Payer: No Typology Code available for payment source | Admitting: Urology

## 2023-08-20 VITALS — BP 176/84 | HR 56 | Ht 68.0 in | Wt 160.0 lb

## 2023-08-20 DIAGNOSIS — R399 Unspecified symptoms and signs involving the genitourinary system: Secondary | ICD-10-CM | POA: Diagnosis not present

## 2023-08-20 DIAGNOSIS — N50819 Testicular pain, unspecified: Secondary | ICD-10-CM | POA: Diagnosis not present

## 2023-08-20 LAB — URINALYSIS, ROUTINE W REFLEX MICROSCOPIC
Bilirubin, UA: NEGATIVE
Ketones, UA: NEGATIVE
Leukocytes,UA: NEGATIVE
Nitrite, UA: NEGATIVE
RBC, UA: NEGATIVE
Specific Gravity, UA: 1.02 (ref 1.005–1.030)
Urobilinogen, Ur: 0.2 mg/dL (ref 0.2–1.0)
pH, UA: 6 (ref 5.0–7.5)

## 2023-08-20 LAB — MICROSCOPIC EXAMINATION

## 2023-08-20 MED ORDER — TAMSULOSIN HCL 0.4 MG PO CAPS: 0.4000 mg | ORAL_CAPSULE | Freq: Every day | ORAL | 3 refills | Status: DC

## 2023-08-20 NOTE — Progress Notes (Signed)
 Assessment: 1. Pain in testicle, unspecified laterality   2. Lower urinary tract symptoms (LUTS)      Plan: Options discussed in detail today-patient elects new medical therapy for lower urinary tract symptoms Rx: Tamsulosin  0.4 mg daily Discussed conservative measures regarding his inguinal discomfort. Patient will follow-up in 6 months for recheck. Also discussed that if his lower urinary tract symptoms do not improve he may want to consider an alternative to Jardiance.  Chief Complaint: LUTS  History of Present Illness:  Eugene Bond is a 78 y.o. male who is seen in consultation from Andrew Truman GRADE., MD for evaluation of LUTS. Patient has been followed routinely by Dr. Steen in [facility] the past few yrs prior to that he was followed by Dr. Roseann.  He was last seen by Dr. Steen 05/2023 when the patient presented with right sided groin and testicular discomfort.  Scrotal US  normal.  He was treated empirically with Doxy and meloxicam for epididymitis. Since that time he has continued to have some bilateral inguinal discomfort.  He is status post bilateral inguinal hernia repair in the past.   Urologic history: He has been treated for ED with tadalafil 20 mg as needed he is not very sexually active anymore as his wife has medical issues. He also has BPH with LUTS and a baseline IPSS of 12. Has had prostate biopsies previously which were benign. Had an MRI of the prostate 2019 showing PI-RADS 4 lesion.  Prostate volume 28.8-- Biopsy neg PSA 0.16 on 10/08/2022. Psa never > 1 Nl renal US  03/2023  Patient reports that several months ago he was started on Jardiance and since that time his lower urinary tract symptoms have gotten a lot worse.  Current IPSS is 19.  Biggest issues are frequency and urgency.      Past Medical History:  Past Medical History:  Diagnosis Date   Aortic aneurysm (HCC)    Coronary artery disease    Hypertension    Myocardial infarct [facility])      Past Surgical History:  Past Surgical History:  Procedure Laterality Date   AORTA SURGERY     CORONARY STENT PLACEMENT      Allergies:  No Known Allergies  Family History:  No family history on file.  Social History:  Social History   Tobacco Use   Smoking status: Former   Smokeless tobacco: Current  Substance Use Topics   Alcohol use: No   Drug use: No    Review of symptoms:  Constitutional:  Negative for unexplained weight loss, night sweats, fever, chills ENT:  Negative for nose bleeds, sinus pain, painful swallowing CV:  Negative for chest pain, shortness of breath, exercise intolerance, palpitations, loss of consciousness Resp:  Negative for cough, wheezing, shortness of breath GI:  Negative for nausea, vomiting, diarrhea, bloody stools GU:  Positives noted in HPI; otherwise negative for gross hematuria, dysuria, urinary incontinence Neuro:  Negative for seizures, poor balance, limb weakness, slurred speech Psych:  Negative for lack of energy, depression, anxiety Endocrine:  Negative for polydipsia, polyuria, symptoms of hypoglycemia (dizziness, hunger, sweating) Hematologic:  Negative for anemia, purpura, petechia, prolonged or excessive bleeding, use of anticoagulants  Allergic:  Negative for difficulty breathing or choking as a result of exposure to anything; no shellfish allergy; no allergic response (rash/itch) to materials, foods  Physical exam: BP (!) 176/84   Pulse (!) 56   Ht 5' 8 (1.727 m)   Wt 160 lb (72.6 kg)   BMI 24.33 kg/m  GENERAL APPEARANCE:  Well appearing, well developed, well nourished, NAD  GU: Normal-appearing phallus.  Palpably normal testes and cords.  There is tenderness palpating the inguinal ring on the right more than left.  No evidence of recurrent hernia.  Bilateral inguinal hernia well-healed incisions.  DRE: Normal sphincter tone; prostate is approximately 30 g without evidence of nodules or induration.  Results: Ua neg

## 2024-02-17 ENCOUNTER — Encounter: Payer: Self-pay | Admitting: Urology

## 2024-02-17 ENCOUNTER — Ambulatory Visit: Payer: Medicare PPO | Admitting: Urology

## 2024-02-17 VITALS — BP 108/61 | HR 49 | Ht 68.0 in | Wt 155.0 lb

## 2024-02-17 DIAGNOSIS — R399 Unspecified symptoms and signs involving the genitourinary system: Secondary | ICD-10-CM | POA: Diagnosis not present

## 2024-02-17 DIAGNOSIS — R1031 Right lower quadrant pain: Secondary | ICD-10-CM | POA: Diagnosis not present

## 2024-02-17 DIAGNOSIS — R1032 Left lower quadrant pain: Secondary | ICD-10-CM | POA: Diagnosis not present

## 2024-02-17 LAB — URINALYSIS, ROUTINE W REFLEX MICROSCOPIC
Bilirubin, UA: NEGATIVE
Glucose, UA: NEGATIVE
Leukocytes,UA: NEGATIVE
Nitrite, UA: NEGATIVE
Protein,UA: NEGATIVE
RBC, UA: NEGATIVE
Specific Gravity, UA: 1.015 (ref 1.005–1.030)
Urobilinogen, Ur: 1 mg/dL (ref 0.2–1.0)
pH, UA: 6 (ref 5.0–7.5)

## 2024-02-17 LAB — MICROSCOPIC EXAMINATION

## 2024-02-17 NOTE — Progress Notes (Signed)
 Assessment: 1. Lower urinary tract symptoms (LUTS)   2. Inguinal pain of both sides     Plan: I personally reviewed the patient's chart including provider notes, lab results. Will monitor lower urinary tract symptoms off medications at this time. Return to office in 6 months.  Chief Complaint: Chief Complaint  Patient presents with   Benign Prostatic Hypertrophy    HPI: Eugene Bond is a 78 y.o. male who presents for continued evaluation of lower urinary tract symptoms and scrotal pain.  Urologic History: Patient has been followed routinely by Dr. Steen in [facility] the past few yrs prior to that he was followed by Dr. Roseann.  He was last seen by Dr. Steen 05/2023 when the patient presented with right sided groin and testicular discomfort.  Scrotal U/S normal.  He was treated empirically with Doxy and meloxicam for epididymitis. He continued to have some bilateral inguinal discomfort.  He is status post bilateral inguinal hernia repair in the past.   He has been treated for ED with tadalafil 20 mg as needed.  He is not very sexually active anymore as his wife has medical issues.   He has BPH with LUTS and a baseline IPSS of 12.  Has had prostate biopsies previously which were benign. Had an MRI of the prostate 2019 showing PI-RADS 4 lesion.  Prostate volume 28.8-- Biopsy neg PSA 0.16 on 10/08/2022. Psa never > 1 Nl renal US  03/2023   At his visit with Dr. Shona in January 2025, he reported worsening of his lower urinary tract symptoms after starting Jardiance.  IPSS = 19.  Primary symptoms are frequency and urgency.  He was started on tamsulosin  0.4 mg daily. Exam demonstrated some tenderness with palpation of the inguinal rings bilaterally, right> left.  DRE demonstrated a 30 g gland without tenderness or nodularity.  He returns today for scheduled follow-up.  He reports improvement in his lower urinary tract symptoms.  He was unable to tolerate the tamsulosin  secondary  to nausea.  He has discontinued his Jardiance.  His lower urinary tract symptoms have improved with discontinuation of the Jardiance.  He continues with occasional frequency.  No dysuria or gross hematuria. IPSS = 5/1. His groin pain has improved.  He has not had any symptoms for the past month.  Portions of the above documentation were copied from a prior visit for review purposes only.  Allergies: Allergies  Allergen Reactions   Amitriptyline Other (See Comments)    Head clogs up, no energy  FELT FOGGY  FELT FOGGY    Head clogs up, no energy   Paroxetine Hcl Other (See Comments)    Erectile dysfunction   Prednisone Nausea Only   Gabapentin Nausea And Vomiting and Nausea Only    Abdominal bloating, pain   Sertraline Nausea Only    And drowsiness  And drowsiness  And drowsiness  And drowsiness, And drowsiness  And drowsiness And drowsiness    And drowsiness    And drowsiness, And drowsiness    PMH: Past Medical History:  Diagnosis Date   Aortic aneurysm (HCC)    Coronary artery disease    Hypertension    Myocardial infarct (HCC)     PSH: Past Surgical History:  Procedure Laterality Date   AORTA SURGERY     CORONARY STENT PLACEMENT      SH: Social History   Tobacco Use   Smoking status: Former   Smokeless tobacco: Current  Substance Use Topics   Alcohol use: No  Drug use: No    ROS: Constitutional:  Negative for fever, chills, weight loss CV: Negative for chest pain, previous MI, hypertension Respiratory:  Negative for shortness of breath, wheezing, sleep apnea, frequent cough GI:  Negative for nausea, vomiting, bloody stool, GERD  PE: BP 108/61   Pulse (!) 49   Ht 5' 8 (1.727 m)   Wt 155 lb (70.3 kg)   BMI 23.57 kg/m  GENERAL APPEARANCE:  Well appearing, well developed, well nourished, NAD HEENT:  Atraumatic, normocephalic, oropharynx clear NECK:  Supple without lymphadenopathy or thyromegaly ABDOMEN:  Soft, non-tender, no  masses EXTREMITIES:  Moves all extremities well, without clubbing, cyanosis, or edema NEUROLOGIC:  Alert and oriented x 3, normal gait, CN II-XII grossly intact MENTAL STATUS:  appropriate BACK:  Non-tender to palpation, No CVAT SKIN:  Warm, dry, and intact   Results: U/A: 0-5 WBCs, 0-2 RBCs

## 2024-08-19 ENCOUNTER — Encounter: Payer: Self-pay | Admitting: Urology

## 2024-08-19 ENCOUNTER — Ambulatory Visit (INDEPENDENT_AMBULATORY_CARE_PROVIDER_SITE_OTHER): Admitting: Urology

## 2024-08-19 VITALS — BP 106/60 | HR 59 | Ht 68.0 in | Wt 155.0 lb

## 2024-08-19 DIAGNOSIS — R399 Unspecified symptoms and signs involving the genitourinary system: Secondary | ICD-10-CM | POA: Diagnosis not present

## 2024-08-19 DIAGNOSIS — R1031 Right lower quadrant pain: Secondary | ICD-10-CM

## 2024-08-19 DIAGNOSIS — R1032 Left lower quadrant pain: Secondary | ICD-10-CM | POA: Diagnosis not present

## 2024-08-19 LAB — MICROSCOPIC EXAMINATION

## 2024-08-19 LAB — URINALYSIS, ROUTINE W REFLEX MICROSCOPIC
Glucose, UA: NEGATIVE
Leukocytes,UA: NEGATIVE
Nitrite, UA: NEGATIVE
Protein,UA: NEGATIVE
RBC, UA: NEGATIVE
Specific Gravity, UA: 1.015 (ref 1.005–1.030)
Urobilinogen, Ur: 1 mg/dL (ref 0.2–1.0)
pH, UA: 6 (ref 5.0–7.5)

## 2024-08-19 NOTE — Progress Notes (Signed)
 "  Assessment: 1. Lower urinary tract symptoms (LUTS)   2. Inguinal pain of both sides     Plan: He is doing very well from the standpoint of his lower urinary tract symptoms as well as his inguinal pain. He is not on any medical therapy for urinary symptoms at the present time. Return to office in 1 year.  Chief Complaint: Chief Complaint  Patient presents with   LUTS    HPI: Eugene Bond is a 79 y.o. male who presents for continued evaluation of lower urinary tract symptoms and scrotal pain.  Urologic History: Patient has been followed routinely by Dr. Steen in [facility] the past few yrs prior to that he was followed by Dr. Roseann.  He was last seen by Dr. Steen 05/2023 when the patient presented with right sided groin and testicular discomfort.  Scrotal U/S normal.  He was treated empirically with Doxy and meloxicam for epididymitis. He continued to have some bilateral inguinal discomfort.  He is status post bilateral inguinal hernia repair in the past.   He has been treated for ED with tadalafil 20 mg as needed.  He is not very sexually active anymore as his wife has medical issues.   He has BPH with LUTS and a baseline IPSS of 12.  Has had prostate biopsies previously which were benign. Had an MRI of the prostate 2019 showing PI-RADS 4 lesion.  Prostate volume 28.8-- Biopsy neg PSA 0.16 on 10/08/2022. Psa never > 1 Nl renal US  03/2023   At his visit with Dr. Shona in January 2025, he reported worsening of his lower urinary tract symptoms after starting Jardiance.  IPSS = 19.  Primary symptoms are frequency and urgency.  He was started on tamsulosin  0.4 mg daily. Exam demonstrated some tenderness with palpation of the inguinal rings bilaterally, right> left.  DRE demonstrated a 30 g gland without tenderness or nodularity.  At his visit in July 2025, he reported improvement in his lower urinary tract symptoms.  He was unable to tolerate the tamsulosin  secondary to nausea.   He had discontinued his Jardiance.  His lower urinary tract symptoms improved with discontinuation of the Jardiance.  He continued with occasional frequency.  No dysuria or gross hematuria. IPSS = 5/1. His groin pain had improved as well.    He returns today for follow-up.  He continues to do very well from a urinary standpoint.  He is not on any medications for his urinary symptoms.  He has nocturia x 2 and occasional weak stream with sensation of incomplete emptying.  No dysuria or gross hematuria. IPSS = 6/2. He has not had any problems with the groin/scrotal pain.  He reports rare episodes in the past 6 months.  Portions of the above documentation were copied from a prior visit for review purposes only.  Allergies: Allergies  Allergen Reactions   Amitriptyline Other (See Comments)    Head clogs up, no energy  FELT FOGGY  FELT FOGGY    Head clogs up, no energy   Paroxetine Hcl Other (See Comments)    Erectile dysfunction   Prednisone Nausea Only   Gabapentin Nausea And Vomiting and Nausea Only    Abdominal bloating, pain   Sertraline Nausea Only    And drowsiness  And drowsiness  And drowsiness  And drowsiness, And drowsiness  And drowsiness And drowsiness    And drowsiness    And drowsiness, And drowsiness    PMH: Past Medical History:  Diagnosis Date  Aortic aneurysm    Coronary artery disease    Hypertension    Myocardial infarct (HCC)     PSH: Past Surgical History:  Procedure Laterality Date   AORTA SURGERY     CORONARY STENT PLACEMENT      SH: Social History   Tobacco Use   Smoking status: Former   Smokeless tobacco: Current  Substance Use Topics   Alcohol use: No   Drug use: No    ROS: Constitutional:  Negative for fever, chills, weight loss CV: Negative for chest pain, previous MI, hypertension Respiratory:  Negative for shortness of breath, wheezing, sleep apnea, frequent cough GI:  Negative for nausea, vomiting, bloody stool,  GERD  PE: BP 106/60   Pulse (!) 59   Ht 5' 8 (1.727 m)   Wt 155 lb (70.3 kg)   BMI 23.57 kg/m  Bond APPEARANCE:  Well appearing, well developed, well nourished, NAD HEENT:  Atraumatic, normocephalic, oropharynx clear NECK:  Supple without lymphadenopathy or thyromegaly ABDOMEN:  Soft, non-tender, no masses EXTREMITIES:  Moves all extremities well, without clubbing, cyanosis, or edema NEUROLOGIC:  Alert and oriented x 3, normal gait, CN II-XII grossly intact MENTAL STATUS:  appropriate BACK:  Non-tender to palpation, No CVAT SKIN:  Warm, dry, and intact GU: Prostate: 30 g, NT, no nodules Rectum: Normal tone,  no masses or tenderness   Results: U/A: 0-5 WBCs, 0-2 RBCs "

## 2024-08-28 ENCOUNTER — Encounter: Payer: Self-pay | Admitting: Urology

## 2025-08-23 ENCOUNTER — Ambulatory Visit: Admitting: Urology
